# Patient Record
Sex: Female | Born: 2000 | Race: Asian | Hispanic: No | Marital: Single | Smoking: Never smoker
Health system: Southern US, Community
[De-identification: ages and names within clinical notes are randomized; demographics above are authoritative.]

## PROBLEM LIST (undated history)

## (undated) DIAGNOSIS — B1910 Unspecified viral hepatitis B without hepatic coma: Secondary | ICD-10-CM

---
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
## 2020-01-03 DIAGNOSIS — E559 Vitamin D deficiency, unspecified: Secondary | ICD-10-CM | POA: Diagnosis not present

## 2020-01-03 DIAGNOSIS — B181 Chronic viral hepatitis B without delta-agent: Secondary | ICD-10-CM | POA: Diagnosis not present

## 2020-01-03 DIAGNOSIS — R760 Raised antibody titer: Secondary | ICD-10-CM | POA: Diagnosis not present

## 2020-01-10 DIAGNOSIS — Z719 Counseling, unspecified: Secondary | ICD-10-CM | POA: Diagnosis not present

## 2020-01-10 DIAGNOSIS — B181 Chronic viral hepatitis B without delta-agent: Secondary | ICD-10-CM | POA: Diagnosis not present

## 2020-01-13 DIAGNOSIS — H1013 Acute atopic conjunctivitis, bilateral: Secondary | ICD-10-CM | POA: Diagnosis not present

## 2020-02-20 DIAGNOSIS — H04123 Dry eye syndrome of bilateral lacrimal glands: Secondary | ICD-10-CM | POA: Diagnosis not present

## 2020-02-24 DIAGNOSIS — H04123 Dry eye syndrome of bilateral lacrimal glands: Secondary | ICD-10-CM | POA: Diagnosis not present

## 2020-05-15 DIAGNOSIS — H5213 Myopia, bilateral: Secondary | ICD-10-CM | POA: Diagnosis not present

## 2021-02-19 DIAGNOSIS — B181 Chronic viral hepatitis B without delta-agent: Secondary | ICD-10-CM | POA: Diagnosis not present

## 2021-06-22 DIAGNOSIS — H5213 Myopia, bilateral: Secondary | ICD-10-CM | POA: Diagnosis not present

## 2021-07-24 NOTE — Progress Notes (Signed)
    SUBJECTIVE:   CHIEF COMPLAINT / HPI: fatigue  Positive PHQ Answered yes to question 9.  Reports no SI/HI for over 1 year.  No plan of action and no access to fire arm at home.  Denies any depressed mood but does endorse lack of motivation recently.  Doe not enjoy doing things that she previously use to since Sept.    Fatigue Has not been sleeping well.  Goes to bed at 2am and wakes at 10 am.  Feeling more fatigues.  No weight loss but feels like weight gain.  Appetite has not decreased, eats when bored and feels like always hungry.  Works out at gym 3x week, school Mon through [place], about 5 hours at school along with study time at home.  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   Meals mostly consist os rice and veggie soup.  Little protein and does not always eat regularly.  Denies any tearful mood, frustration or aggressive outbursts.   PERTINENT  PMH / PSH:  Chronic Active Hep B, followed by [facility]  OBJECTIVE:   BP 109/72   Pulse 95   Ht 5\' 1"  (1.549 m)   Wt 135 lb 12.8 oz (61.6 kg)   LMP 07/13/2021   SpO2 100%   BMI 25.66 kg/m    General: Alert, no acute distress Cardio: Normal S1 and S2, RRR, no r/m/g Pulm: CTAB, normal work of breathing Abdomen: Bowel sounds normal. Abdomen soft and non-tender.  Psych: speech normal, engaging and making good eye contact.  No SI/HI   ASSESSMENT/PLAN:   Fatigue PHQ 9 score 20.  No SI/HI for over 1 year.  Engaging in visit.  Has been involved in work, school and gym, poor nutrition and sleep hygiene.  -Recommend sleep hygiene -Discussed proper nutrition and referral placed for nutrition consult.  Patient will need to call to book appointment with Dr 13/12/2020 -CBC, Vit D, Ferritin today -Mental health resources provided -Can initiate antidepressants if no improvement with lifestyle changes -Follow up with PCP in 2 weeks or sooner if needed -Strict return precautions provided     Gerilyn Pilgrim, MD [facility]  [facility]

## 2021-07-25 ENCOUNTER — Encounter: Payer: Self-pay | Admitting: Family Medicine

## 2021-07-25 ENCOUNTER — Ambulatory Visit (INDEPENDENT_AMBULATORY_CARE_PROVIDER_SITE_OTHER): Payer: Medicaid Other | Admitting: Family Medicine

## 2021-07-25 ENCOUNTER — Other Ambulatory Visit: Payer: Self-pay

## 2021-07-25 ENCOUNTER — Ambulatory Visit: Payer: Medicaid Other | Admitting: Family Medicine

## 2021-07-25 VITALS — BP 109/72 | HR 95 | Ht 61.0 in | Wt 135.8 lb

## 2021-07-25 DIAGNOSIS — Z23 Encounter for immunization: Secondary | ICD-10-CM | POA: Diagnosis not present

## 2021-07-25 DIAGNOSIS — R5383 Other fatigue: Secondary | ICD-10-CM | POA: Diagnosis not present

## 2021-07-25 DIAGNOSIS — E559 Vitamin D deficiency, unspecified: Secondary | ICD-10-CM

## 2021-07-25 NOTE — Patient Instructions (Addendum)
Thank you for coming to see me today. It was a pleasure.   We will get some labs today.  If they are abnormal or we need to do something about them, I will call you.  If they are normal, I will send you a message on MyChart (if it is active) or a letter in the mail.  If you don't hear from [place] in 2 weeks, please call the office at the number below.   Please call to make an appointment with Dr Gerilyn Pilgrim to discuss ways to enhance better nutrition. [phone]  Please follow-up with PCP in 2 weeks  If you have any questions or concerns, please do not hesitate to call the office at [phone].  Best,   Dana Allan, MD    Psychiatry Resource List (Adults and Children) Most of these providers will take Medicaid. please consult your insurance for a complete and updated list of available providers. When calling to make an appointment have your insurance information available to confirm you are covered.  [facility] Behavioral Health Clinics:   [place]: [address]     [phone] [place]: [address] [place],        [phone] [place]: [address],    [phone] [place] Sharper: [address],                   [phone]  [facility]  (Psychiatry & counseling ; adults & children ; will take Medicaid) [address], [place], [place]        [phone]   [facility]  (Psychiatry only; Adults only, will take Medicaid)  [address], [place], [place] [address]       [phone]   [facility] (Psychiatry & counseling ; adults & children ; will take Medicaid [address]  [place] [address]   [phone]    (Spanish therapist)  [facility]  Psychiatry & counseling; Adults and children;  [address]    [place], [place] [address]    [phone]  [facility] (Psychiatry & counseling; adults & children; Medicare no Medicaid)   [address]   [place], [place]  [address]      [phone]    [facility] (up to age 75)  Psychiatry & counseling ,will take Medicaid, must do counseling to receive psychiatry services  [address] [place] [address]        [phone]  [facility] (Psychiatry & counseling; adults & children; will take Medicaid) [address],  [place], [place]  [phone]  [facility]---  Walk-in Mon-Fri, 8:30-5:00 (will take Medicaid)  [address] [place], [place]  [phone]    [facility] --- Walk-In Mon-Friday 8am-3pm ( will take Medicaid, Psychiatry, Adults & children,  [address], [place], [place]   [phone]   [facility]--, Walk-in M-F 8am-12pm and 1pm -3pm   (Counseling, Psychiatry, will take Medicaid, adults & children)  [address], [place], [place]  [phone]    What can I do to improve my insomnia? -- You can follow good "sleep hygiene." That means that you: ?Sleep only long enough to feel rested and then get out of bed ?Go to bed and get up at the same time every day ?Do not try to force yourself to sleep.  If you can't sleep, get out of bed and try again later. ?Have coffee, tea, and other foods that have caffeine only in the morning ?Avoid alcohol in the late afternoon, evening, and bedtime ?Avoid smoking, especially in the evening ?Keep your bedroom dark, cool, quiet, and free of reminders of work or other things that cause you stress ?Solve problems you have before you go to bed ?Exercise several days a week, but not right before bed ?Avoid looking at phones or reading devices ("e-books") that give off light before bed. This can make it harder to fall asleep. Other things that can improve sleep include: ?Relaxation therapy, in which you focus on relaxing all the muscles in your body 1 by 1 ?Working with a Conservator, museum/gallery to deal with the problems that might be  causing poor sleep  Sleep hygiene guidelines Recommendation Details  Regular bedtime and rise time Having a consistent bedtime and rise time leads to more regular sleep schedules and avoids periods of sleep deprivation or periods of extended wakefulness during the night.  Avoid napping Avoid napping, especially naps lasting longer than 1 hour and naps late in the day.  Limit caffeine Avoid caffeine after lunch. The time between lunch and bedtime represents approximately 2 half-lives for caffeine, and this time window allows for most caffeine to be metabolized before bedtime.  Limit alcohol Recommendations are typically focused on avoiding alcohol near bedtime. Alcohol is initially sedating, but activating as it is metabolized. Alcohol also negatively impacts sleep architecture.  Avoid nicotine Nicotine is a stimulant and should be avoided near bedtime and at night.  Exercise Daytime physical activity is encouraged, in particular, 4 to 6 hours before bedtime, as this may facilitate sleep onset. Rigorous exercise within 2 hours of bedtime is discouraged.  Keep the sleep environment quiet and dark Noise and light exposure during the night can disrupt sleep. White noise or ear plugs are often recommended to reduce noise. Using blackout shades or an eye mask is commonly recommended to reduce light. This may also include avoiding exposure to television or technology near bedtime, as this can have an impact on circadian rhythms by shifting sleep timing later.   Bedroom clock Avoid checking the time at night. This includes alarm clocks and other time pieces (eg, watches and smart phones). Checking the time increases cognitive arousal and prolongs wakefulness.  Evening eating Avoid a large meal near bedtime, but don't go to bed hungry. Eat a healthy and filling meal in the evening and avoid late-night snacks.

## 2021-07-28 ENCOUNTER — Encounter: Payer: Self-pay | Admitting: Family Medicine

## 2021-07-28 DIAGNOSIS — R5383 Other fatigue: Secondary | ICD-10-CM | POA: Insufficient documentation

## 2021-07-28 NOTE — Assessment & Plan Note (Signed)
PHQ 9 score 20.  No SI/HI for over 1 year.  Engaging in visit.  Has been involved in work, school and gym, poor nutrition and sleep hygiene.  -Recommend sleep hygiene -Discussed proper nutrition and referral placed for nutrition consult.  Patient will need to call to book appointment with Dr Gerilyn Pilgrim -CBC, Vit D, Ferritin today -Mental health resources provided -Can initiate antidepressants if no improvement with lifestyle changes -Follow up with PCP in 2 weeks or sooner if needed -Strict return precautions provided

## 2021-08-02 LAB — CBC
Hematocrit: 41.2 % (ref 34.0–46.6)
Hemoglobin: 13.2 g/dL (ref 11.1–15.9)
MCH: 25.8 pg — ABNORMAL LOW (ref 26.6–33.0)
MCHC: 32 g/dL (ref 31.5–35.7)
MCV: 81 fL (ref 79–97)
Platelets: 292 10*3/uL (ref 150–450)
RBC: 5.12 x10E6/uL (ref 3.77–5.28)
RDW: 14.2 % (ref 11.7–15.4)
WBC: 8.7 10*3/uL (ref 3.4–10.8)

## 2021-08-02 LAB — VITAMIN D 1,25 DIHYDROXY
Vitamin D 1, 25 (OH)2 Total: 119 pg/mL — ABNORMAL HIGH
Vitamin D2 1, 25 (OH)2: <10 pg/mL
Vitamin D3 1, 25 (OH)2: 119 pg/mL

## 2021-08-02 LAB — FERRITIN: Ferritin: 73 ng/mL (ref 15–150)

## 2021-08-28 ENCOUNTER — Ambulatory Visit (INDEPENDENT_AMBULATORY_CARE_PROVIDER_SITE_OTHER): Payer: Medicaid Other | Admitting: Family Medicine

## 2021-08-28 ENCOUNTER — Other Ambulatory Visit: Payer: Self-pay

## 2021-08-28 VITALS — BP 110/62 | HR 92 | Wt 136.0 lb

## 2021-08-28 DIAGNOSIS — S93402A Sprain of unspecified ligament of left ankle, initial encounter: Secondary | ICD-10-CM

## 2021-08-28 MED ORDER — DICLOFENAC SODIUM 1 % EX GEL: 2.0000 g | Freq: Two times a day (BID) | CUTANEOUS | 0 refills | Status: DC | PRN

## 2021-08-28 NOTE — Progress Notes (Signed)
° ° °  SUBJECTIVE:   CHIEF COMPLAINT / HPI:   Kendra Chapman is a 20 yo who presents for ankle sprain. She states she was at aunts house yesterday and was walking outside the house and rolled her left ankle. She does endorse hearing a pop. Was able to bear weight after it happened. Has tried taking pain reliever and putting ginger on it which she feels helped. Walks around using the wall for assistance. Has had a history of ankle sprains in the past.    OBJECTIVE:   BP 110/62    Pulse 92    Wt 136 lb (61.7 kg)    LMP 08/08/2021    SpO2 96%    BMI 25.70 kg/m    Physical exam General: well appearing, NAD Cardiovascular: RRR, no murmurs Lungs: CTAB. Normal WOB Abdomen: soft, non-distended, non-tender Skin: warm, dry MSK: L ankle in supinated position. Edema noted on lateral L ankle at malleolus. Contusions noted above and below malleolus. No bony tenderness, but with tenderness to palpation along lateral forefoot. Decreased ROM and strength. Is able to ambulate slowly.        ASSESSMENT/PLAN:   No problem-specific Assessment & Plan notes found for this encounter.   L Ankle sprain Patient presents after internally rotating her left ankle while walking yesterday. On exam L ankle supinated with edema with bruising above and below L malleolus. No bony tenderness. She is able to bear weight on this foot. Per [place] Ankle Rule foot XR not indicated at this time. Discussed supportive management with icing, elevating, using an ace wrap and NSAIDs for pain relief. Also prescribed Voltaren gel for possible additional pain relief per request of family. Discussed not bearing much weight on it over the next 2 weeks due to susceptibility for re injury. Gave strict return precautions if no improvement over the next 2 weeks. Family agreeable with plan.   Cora Collum, DO [facility]

## 2021-08-28 NOTE — Patient Instructions (Addendum)
It was great seeing you today!  You came in for sprained ankle, and as we discussed make sure you ice it, elevate and avoid putting much weight on it.  You can get ankle wrap at the store to keep it stabilized.  Continue the ibuprofen to help reduce the swelling, and also prescribed Voltaren gel to use topically. Be sure to take it easy over the next couple of weeks because you are more prone to reinjure it.   Return if your ankle does not improve over the next 1-2 weeks despite the above measures.   Feel free to call with any questions or concerns at any time, at [phone].   Take care,  Dr. Cora Collum [facility]   Ankle Sprain An ankle sprain is a stretch or tear in a ligament in the ankle. Ligaments are tissues that connect bones to each other. The two most common types of ankle sprains are: Inversion sprain. This happens when the foot turns inward and the ankle rolls outward. It affects the ligament on the outside of the foot (lateral ligament). Eversion sprain. This happens when the foot turns outward and the ankle rolls inward. It affects the ligament on the inner side of the foot (medial ligament). What are the causes? This condition is often caused by accidentally rolling or twisting the ankle. What increases the risk? You are more likely to develop this condition if you play sports. What are the signs or symptoms? Symptoms of this condition include: Pain in your ankle. Swelling. Bruising. This may develop right after you sprain your ankle or 1-2 days later. Trouble standing or walking, especially when you turn or change directions. How is this diagnosed? This condition is diagnosed with: A physical exam. During the exam, your health care provider will press on certain parts of your foot and ankle and try to move them in certain ways. X-ray imaging. These may be taken to see how severe the sprain is and to check for broken bones. How is this  treated? This condition may be treated with: A brace or splint. This is used to keep the ankle from moving until it heals. An elastic bandage. This is used to support the ankle. Crutches. Pain medicine. Surgery. This may be needed if the sprain is severe. Physical therapy. This may help to improve the range of motion in the ankle. Follow these instructions at home: If you have a brace or a splint: Wear the brace or splint as told by your health care provider. Remove it only as told by your health care provider. Loosen the brace or splint if your toes tingle, become numb, or turn cold and blue. Keep the brace or splint clean. If the brace or splint is not waterproof: Do not let it get wet. Cover it with a watertight covering when you take a bath or a shower. If you have an elastic bandage (dressing): Remove it to shower or bathe. Try not to move your ankle much, but wiggle your toes from time to time. This helps to prevent swelling. Adjust the dressing to make it more comfortable if it feels too tight. Loosen the dressing if you have numbness or tingling in your foot, or if your foot becomes cold and blue. Managing pain, stiffness, and swelling  Take over-the-counter and prescription medicines only as told by your health care provider. For 2-3 days, keep your ankle raised (elevated) above the level of your heart as much as possible. If directed,  put ice on the injured area: If you have a removable brace or splint, remove it as told by your health care provider. Put ice in a plastic bag. Place a towel between your skin and the bag. Leave the ice on for 20 minutes, 2-3 times a day. General instructions Rest your ankle. Do not use the injured limb to support your body weight until your health care provider says that you can. Use crutches as told by your health care provider. Do not use any products that contain nicotine or tobacco, such as cigarettes, e-cigarettes, and chewing tobacco.  If you need help quitting, ask your health care provider. Keep all follow-up visits as told by your health care provider. This is important. Contact a health care provider if: You have rapidly increasing bruising or swelling. Your pain is not relieved with medicine. Get help right away if: Your foot or toes become numb or blue. You have severe pain that gets worse. Summary An ankle sprain is a stretch or tear in a ligament in the ankle. Ligaments are tissues that connect bones to each other. This condition is often caused by accidentally rolling or twisting the ankle. Symptoms include pain, swelling, bruising, and trouble walking. To relieve pain and swelling, put ice on the affected ankle, raise your ankle above the level of your heart, and use an elastic bandage. Keep all follow-up visits as told by your health care provider. This is important. This information is not intended to replace advice given to you by your health care provider. Make sure you discuss any questions you have with your health care provider. Document Revised: 10/19/2020 Document Reviewed: 10/19/2020 Elsevier Patient Education  2022 [employer].

## 2022-06-10 ENCOUNTER — Emergency Department (HOSPITAL_COMMUNITY)
Admission: EM | Admit: 2022-06-10 | Discharge: 2022-06-10 | Disposition: A | Discharge status: Home / Self Care | Payer: Medicaid Other | Attending: Emergency Medicine | Admitting: Emergency Medicine

## 2022-06-10 ENCOUNTER — Emergency Department (HOSPITAL_COMMUNITY): Payer: Medicaid Other

## 2022-06-10 ENCOUNTER — Other Ambulatory Visit: Payer: Self-pay

## 2022-06-10 ENCOUNTER — Encounter (HOSPITAL_COMMUNITY): Payer: Self-pay

## 2022-06-10 DIAGNOSIS — M542 Cervicalgia: Secondary | ICD-10-CM | POA: Diagnosis present

## 2022-06-10 DIAGNOSIS — Y92410 Unspecified street and highway as the place of occurrence of the external cause: Secondary | ICD-10-CM | POA: Insufficient documentation

## 2022-06-10 DIAGNOSIS — M7918 Myalgia, other site: Secondary | ICD-10-CM

## 2022-06-10 DIAGNOSIS — M791 Myalgia, unspecified site: Secondary | ICD-10-CM | POA: Diagnosis not present

## 2022-06-10 MED ORDER — CYCLOBENZAPRINE HCL 10 MG PO TABS: 10.0000 mg | ORAL_TABLET | Freq: Two times a day (BID) | ORAL | 0 refills | Status: DC | PRN
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
## 2022-06-10 NOTE — ED Notes (Signed)
Patient verbalizes understanding of discharge instructions. Opportunity for questioning and answers were provided. Pt discharged from ED. 

## 2022-06-10 NOTE — Discharge Instructions (Signed)
You have been seen here for neck pain. I recommend taking over-the-counter pain medications like ibuprofen and/or Tylenol every 6 as needed.  Please follow dosage and on the back of bottle.  I also recommend applying heat to the area and stretching out the muscles as this will help decrease stiffness and pain.  I have given you information on exercises please follow.  I have given you a muscle relaxer please take as prescribed this medication make you drowsy do not consume alcohol or operate heavy and she will take this medication.  Symptoms not improve after 2 weeks time please follow-up your PCP  Come back to the emergency department if you develop chest pain, shortness of breath, severe abdominal pain, uncontrolled nausea, vomiting, diarrhea.
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
## 2023-11-20 ENCOUNTER — Ambulatory Visit (HOSPITAL_COMMUNITY)
Admission: EM | Admit: 2023-11-20 | Discharge: 2023-11-20 | Disposition: A | Discharge status: Home / Self Care | Attending: Family Medicine | Admitting: Family Medicine

## 2023-11-20 ENCOUNTER — Encounter (HOSPITAL_COMMUNITY): Payer: Self-pay

## 2023-11-20 DIAGNOSIS — J069 Acute upper respiratory infection, unspecified: Secondary | ICD-10-CM

## 2023-11-20 HISTORY — DX: Unspecified viral hepatitis B without hepatic coma: B19.10

## 2023-11-20 MED ORDER — BENZONATATE 200 MG PO CAPS: 200.0000 mg | ORAL_CAPSULE | Freq: Three times a day (TID) | ORAL | 0 refills | Status: AC | PRN

## 2023-11-20 NOTE — ED Provider Notes (Signed)
 [facility]    CSN: [id number] Arrival date & time: 11/20/23  1200      History   Chief Complaint Chief Complaint  Patient presents with   Cough    HPI Kendra Chapman is a 23 y.o. female.    Cough Associated symptoms: sore throat    Patient is here for URI symptoms x 1 week.  Sore throat, cough, headache, some vomiting after the cough.  No fevers/chills.  Some wheezing/sob at times.  Taking tylenol/dayquil.        Past Medical History:  Diagnosis Date   Hepatitis B     Patient Active Problem List   Diagnosis Date Noted   Fatigue 07/28/2021   Acne vulgaris 01/08/2018   Encounter for routine child health examination without abnormal findings 01/08/2018   Rash and nonspecific skin eruption 11/28/2017   Chronic active viral hepatitis B (HCC) 05/08/2012    History reviewed. No pertinent surgical history.  OB History   No obstetric history on file.      Home Medications    Prior to Admission medications   Not on File    Family History Family History  Problem Relation Age of Onset   Hepatitis Mother        Chronic active hep B    Social History Social History   Tobacco Use   Smoking status: Never   Smokeless tobacco: Never  Vaping Use   Vaping status: Never Used  Substance Use Topics   Alcohol use: Yes    Comment: occ   Drug use: Never     Allergies   Patient has no known allergies.   Review of Systems Review of Systems  Constitutional: Negative.   HENT:  Positive for congestion and sore throat.   Respiratory:  Positive for cough.   Cardiovascular: Negative.   Gastrointestinal: Negative.   Genitourinary: Negative.   Musculoskeletal: Negative.   Psychiatric/Behavioral: Negative.       Physical Exam Triage Vital Signs ED Triage Vitals  Encounter Vitals Group     BP 11/20/23 1342 117/78     Systolic BP Percentile --      Diastolic BP Percentile --      Pulse Rate 11/20/23 1342 87     Resp 11/20/23 1342 16      Temp 11/20/23 1342 97.9 F (36.6 C)     Temp Source 11/20/23 1342 Oral     SpO2 11/20/23 1342 97 %     Weight 11/20/23 1341 135 lb (61.2 kg)     Height 11/20/23 1341 5' (1.524 m)     Head Circumference --      Peak Flow --      Pain Score 11/20/23 1340 7     Pain Loc --      Pain Education --      Exclude from Growth Chart --    No data found.  Updated Vital Signs BP 117/78 (BP Location: Left Arm)   Pulse 87   Temp 97.9 F (36.6 C) (Oral)   Resp 16   Ht 5' (1.524 m)   Wt 61.2 kg   LMP 11/03/2023 (Approximate)   SpO2 97%   BMI 26.37 kg/m   Visual Acuity Right Eye Distance:   Left Eye Distance:   Bilateral Distance:    Right Eye Near:   Left Eye Near:    Bilateral Near:     Physical Exam Constitutional:      Appearance: Normal appearance. She is normal weight.  Cardiovascular:     Rate and Rhythm: Normal rate and regular rhythm.  Pulmonary:     Effort: Pulmonary effort is normal.     Breath sounds: Normal breath sounds. No wheezing, rhonchi or rales.  Musculoskeletal:     Cervical back: Normal range of motion and neck supple. No tenderness.  Lymphadenopathy:     Cervical: No cervical adenopathy.  Skin:    General: Skin is warm.  Neurological:     General: No focal deficit present.     Mental Status: She is alert.  Psychiatric:        Mood and Affect: Mood normal.      UC Treatments / Results  Labs (all labs ordered are listed, but only abnormal results are displayed) Labs Reviewed - No data to display  EKG   Radiology No results found.  Procedures Procedures (including critical care time)  Medications Ordered in UC Medications - No data to display  Initial Impression / Assessment and Plan / UC Course  I have reviewed the triage vital signs and the nursing notes.  Pertinent labs & imaging results that were available during my care of the patient were reviewed by me and considered in my medical decision making (see chart for  details).  Final Clinical Impressions(s) / UC Diagnoses   Final diagnoses:  Viral URI with cough     Discharge Instructions      You were seen today for upper respiratory symptoms.  Your symptoms appear to be due to a virus at this time.  I have sent out a medication to help with cough. You may use zyrtec or claritin as well for any sinus drainage, congestion.  Please return if you are not feeling improved by next week.     ED Prescriptions     Medication Sig Dispense Auth. Provider   benzonatate (TESSALON) 200 MG capsule Take 1 capsule (200 mg total) by mouth 3 (three) times daily as needed for cough. 21 capsule Jannifer Franklin, MD      PDMP not reviewed this encounter.   Jannifer Franklin, MD 11/20/23 1352

## 2023-11-20 NOTE — ED Triage Notes (Signed)
 Chief Complaint: sore throat and productive cough. Headache with coughing and sometimes emesis with cough as well.   Sick exposure: Yes- older brother has a cough but not tested  Onset: This past Friday.   Prescriptions or OTC medications tried: Yes- tylenol and Dayquil    with no relief  New foods, medications, or products: No  Recent Travel: No

## 2023-11-20 NOTE — Discharge Instructions (Signed)
 You were seen today for upper respiratory symptoms.  Your symptoms appear to be due to a virus at this time.  I have sent out a medication to help with cough. You may use zyrtec or claritin as well for any sinus drainage, congestion.  Please return if you are not feeling improved by next week.

## 2023-12-12 ENCOUNTER — Ambulatory Visit: Admitting: Family Medicine

## 2023-12-12 ENCOUNTER — Encounter: Payer: Self-pay | Admitting: Family Medicine

## 2023-12-12 VITALS — BP 111/76 | HR 107 | Ht 60.0 in | Wt 133.0 lb

## 2023-12-12 DIAGNOSIS — J301 Allergic rhinitis due to pollen: Secondary | ICD-10-CM | POA: Diagnosis present

## 2023-12-12 DIAGNOSIS — Z0000 Encounter for general adult medical examination without abnormal findings: Secondary | ICD-10-CM

## 2023-12-12 MED ORDER — FLUTICASONE PROPIONATE 50 MCG/ACT NA SUSP: 2.0000 | Freq: Every day | NASAL | 2 refills | Status: AC

## 2023-12-12 MED ORDER — LEVOCETIRIZINE DIHYDROCHLORIDE 5 MG PO TABS: 5.0000 mg | ORAL_TABLET | Freq: Every day | ORAL | 2 refills | Status: AC

## 2023-12-12 NOTE — Patient Instructions (Addendum)
 It was great to see you today! Thank you for choosing [facility] for your primary care. Kendra Chapman was seen for allergies.  Today we addressed: Please use Flonase nasal spray 2 puffs in each side of your nose every day and Xyzal once daily. Stop taking Zyrtec. You can take a spoonful of honey for your sore throat.  If you feel worse or do not get better in a week or so come back Don't forget to schedule your pap smear!  If you haven't already, sign up for My Chart to have easy access to your labs results, and communication with your primary care physician.  Call the clinic at [phone] if your symptoms worsen or you have any concerns.  Please arrive 15 minutes before your appointment to ensure smooth check in process.  We appreciate your efforts in making this happen.  Thank you for allowing me to participate in your care, Para March, DO 12/12/2023, 2:14 PM PGY-1, [facility]

## 2023-12-12 NOTE — Progress Notes (Signed)
    SUBJECTIVE:   CHIEF COMPLAINT / HPI:   ALLERGIES Has had symptom since March, was seen in urgent care last month for a cough and given Tessalon Perles which not improve her symptoms Medications tried: Zyrtec, Mucinex, Ibuprofen  Symptoms include sneezing, sore throat described as scratchiness, rhinorrhea, nasal congestion, intermittent dry cough, headache, fatigue  Patient states she is not sleeping well at night because she cannot breathe through her nose Denies fever, neck stiffness, shortness of breath, rash  PERTINENT  PMH / PSH: hepatitis B  OBJECTIVE:   BP 111/76   Pulse (!) 107   Ht 5' (1.524 m)   Wt 133 lb (60.3 kg)   LMP 12/07/2023 (Approximate)   SpO2 98%   BMI 25.97 kg/m   Slightly tachycardic on both checks, suspect this is secondary to patient not feeling well  GEN: No acute distress, sitting comfortably HEENT: Right TM slightly erythematous with no bulging and normal light reflex.  Left TM clear with normal light reflex.  Landmarks visualized bilaterally.  Bilateral nasal turbinates swollen with rhinorrhea present.  Oropharynx erythematous with no exudate. CV: Slightly tachycardic, regular rhythm.  No murmurs Respiratory: Breathing comfortably on room air, CTAB  ASSESSMENT/PLAN:   Assessment & Plan Seasonal allergic rhinitis due to pollen Symptoms consistent with seasonal allergies.  Has taken Zyrtec with minimal improvement.  No sign of sinus infection, low concern for asthma given no wheezing and no history of it, overall afebrile and well-appearing. - Will switch to Xyzal for antihistamine daily - Flonase 2 sprays in each nare daily through allergy season - Advised to return if her symptoms worsen or do not improve after a week Healthcare maintenance Due for Pap, patient would like to schedule this at a later date     Para March, DO [facility]

## 2024-07-07 ENCOUNTER — Ambulatory Visit: Admitting: Family Medicine

## 2024-07-07 VITALS — BP 109/79 | HR 81 | Wt 136.6 lb

## 2024-07-07 DIAGNOSIS — L84 Corns and callosities: Secondary | ICD-10-CM | POA: Diagnosis present

## 2024-07-07 NOTE — Progress Notes (Signed)
    SUBJECTIVE:   CHIEF COMPLAINT / HPI:   L little toe corn - Present for 1 month - Has attempted corn removal at [facility], peels off the surrounding skin but not the corn itself - Painful when applying pressure to her feet bilaterally - No recent injury or signs of infection - Usually wears Uggs or tennis shoes, mostly supportive - No prior corns  PERTINENT  PMH / PSH: Hep B  OBJECTIVE:   BP 109/79   Pulse 81   Wt 136 lb 9.6 oz (62 kg)   SpO2 100%   BMI 26.68 kg/m   General: Awake and Alert in NAD HEENT: NCAT. Sclera anicteric. No rhinorrhea. Respiratory: Normal WOB on RA. Extremities: Able to move all extremities. No BLE edema, no deformities or significant joint findings. Skin: Warm and dry.  Corn noted over the plantar aspect left little toe, no signs of infection or irritation noted.  Previously used a corn pad, markings are visible on exam Neuro: A&Ox3. No focal neurological deficits.  ASSESSMENT/PLAN:   Assessment & Plan Corn of foot Shave Skin Lesion Procedure Note:  After written informed consent was obtained, the skin was prepped with alcohol. A DermaBlade and scalpel was utilized to smoothen the corn.  There was very minimal bleeding noted, bacitracin ointment was applied, and covered with a Band-Aid.  Wound instructions were given. The patient tolerated the procedure well and the area felt better with pressure after the procedure.  Discussed that the patient could use offloading pads, pumice stone, and warm soaks for additional care. Advised to monitor for signs of infection. Referral to podiatry was provided.   Kathrine Melena, DO [facility]

## 2024-07-07 NOTE — Patient Instructions (Addendum)
 It was great to see you today! Thank you for choosing [facility] for your primary care. Kendra Chapman was seen for L little toe plantar corn.  Today we addressed: L little toe corn - we performed a small procedure to remove some of the thickness of the corn. No major complications during the procedure, just some mild bleeding. We applied some antibiotic ointment over the area and covered with a bandaid. Please monitor for signs of infection such as fever, irritation, redness, and warmth near the area. Referral to podiatry was placed  You should return to our clinic Return if symptoms worsen or fail to improve. Please arrive 15 minutes before your appointment to ensure smooth check in process.  We appreciate your efforts in making this happen.  Thank you for allowing me to participate in your care, Kathrine Melena, DO 07/07/2024, 11:22 AM PGY-2, [facility]

## 2024-08-11 ENCOUNTER — Ambulatory Visit: Admitting: Podiatry

## 2024-08-11 ENCOUNTER — Encounter: Payer: Self-pay | Admitting: Podiatry

## 2024-08-11 DIAGNOSIS — D492 Neoplasm of unspecified behavior of bone, soft tissue, and skin: Secondary | ICD-10-CM | POA: Diagnosis not present

## 2024-08-11 NOTE — Progress Notes (Signed)
  Subjective:  Patient ID: Kendra Chapman, female    DOB: [birth year],   MRN: [id number]  Chief Complaint  Patient presents with   Callouses    I have a corn callus on the bottom of my left foot. (5th met)    23 y.o. female presents for concern of her left foot around her fifth toe.  Relates it has been present since October.  It has been painful when walking on it.  She has had it shaved off and helped for a little while but has returned.. Denies any other pedal complaints. Denies n/v/f/c.   Past Medical History:  Diagnosis Date   Hepatitis B     Objective:  Physical Exam: Vascular: DP/PT pulses 2/4 bilateral. CFT <3 seconds. Normal hair growth on digits. No edema.  Skin. No lacerations or abrasions bilateral feet.  Plantar fifth metatarsal lesion noted.Multiple capillary budding is noted throughout with cauliflower-like appearance and loss of skin tension lines within the lesion itself. Musculoskeletal: MMT 5/5 bilateral lower extremities in DF, PF, Inversion and Eversion. Deceased ROM in DF of ankle joint.  Neurological: Sensation intact to light touch.   Assessment:   1. Skin neoplasm      Plan:  Patient was evaluated and treated and all questions answered. -Discussed warts and their etiology with patient and treatment options.  -Hyperkeratotic tissue was debrided with chisel without incident.  -Recommend use of OTC compound W.  -Application of cantrhone provided today. Advised patient to remove bandaging tomorrow.  -Discussed future options such as laser treatment if unsuccessful.  -Advised good supportive shoes and inserts -Patient to return to office in 3 weeks or sooner if condition worsens.   Asberry Failing, DPM

## 2024-09-06 ENCOUNTER — Encounter: Payer: Self-pay | Admitting: Podiatry

## 2024-09-06 ENCOUNTER — Ambulatory Visit: Admitting: Podiatry

## 2024-09-06 DIAGNOSIS — D492 Neoplasm of unspecified behavior of bone, soft tissue, and skin: Secondary | ICD-10-CM | POA: Diagnosis not present

## 2024-09-06 NOTE — Progress Notes (Signed)
"  °  Subjective:  Patient ID: Kendra Chapman, female    DOB: [birth year],   MRN: [id number]  Chief Complaint  Patient presents with   Plantar Warts    It's doing good.    23 y.o. female presents for follow-up of left foot wart.  Relates doing much better.  Had some itchiness afterwards but overall doing well.  Relates still some might be there.  Denies any other pedal complaints. Denies n/v/f/c.   Past Medical History:  Diagnosis Date   Hepatitis B     Objective:  Physical Exam: Vascular: DP/PT pulses 2/4 bilateral. CFT <3 seconds. Normal hair growth on digits. No edema.  Skin. No lacerations or abrasions bilateral feet.  Plantar fifth metatarsal lesion noted.Multiple capillary budding is noted throughout with cauliflower-like appearance and loss of skin tension lines within the lesion itself. Much improved.  Musculoskeletal: MMT 5/5 bilateral lower extremities in DF, PF, Inversion and Eversion. Deceased ROM in DF of ankle joint.  Neurological: Sensation intact to light touch.   Assessment:   1. Skin neoplasm      Plan:  Patient was evaluated and treated and all questions answered. -Discussed warts and their etiology with patient and treatment options.  -Hyperkeratotic tissue was debrided with chisel without incident.  -Recommend use of OTC compound W.  -Application of cantrhone provided today. Advised patient to remove bandaging tomorrow.  -Discussed future options such as laser treatment if unsuccessful.  -Advised good supportive shoes and inserts -Patient to return to office in 3 weeks or sooner if condition worsens.   Asberry Failing, DPM    "

## 2024-09-29 ENCOUNTER — Ambulatory Visit (INDEPENDENT_AMBULATORY_CARE_PROVIDER_SITE_OTHER): Admitting: Podiatry

## 2024-09-29 DIAGNOSIS — Z91199 Patient's noncompliance with other medical treatment and regimen due to unspecified reason: Secondary | ICD-10-CM

## 2024-09-29 NOTE — Progress Notes (Signed)
 No show
